# Patient Record
Sex: Female | Born: 1980 | Hispanic: Yes
Health system: Southern US, Community
[De-identification: ages and names within clinical notes are randomized; demographics above are authoritative.]

---

## 2011-05-05 ENCOUNTER — Encounter (HOSPITAL_COMMUNITY): Payer: Self-pay | Admitting: Emergency Medicine

## 2011-05-05 ENCOUNTER — Emergency Department (HOSPITAL_COMMUNITY)
Admission: EM | Admit: 2011-05-05 | Discharge: 2011-05-06 | Disposition: A | Discharge status: Home / Self Care | Payer: Self-pay | Attending: Emergency Medicine | Admitting: Emergency Medicine

## 2011-05-05 DIAGNOSIS — R109 Unspecified abdominal pain: Secondary | ICD-10-CM | POA: Insufficient documentation

## 2011-05-05 DIAGNOSIS — O039 Complete or unspecified spontaneous abortion without complication: Secondary | ICD-10-CM | POA: Insufficient documentation

## 2011-05-05 LAB — URINE MICROSCOPIC-ADD ON

## 2011-05-05 LAB — DIFFERENTIAL
Eosinophils Relative: 1 % (ref 0–5)
Lymphocytes Relative: 16 % (ref 12–46)
Lymphs Abs: 2 10*3/uL (ref 0.7–4.0)
Monocytes Absolute: 0.8 10*3/uL (ref 0.1–1.0)

## 2011-05-05 LAB — WET PREP, GENITAL
Clue Cells Wet Prep HPF POC: NONE SEEN
Trich, Wet Prep: NONE SEEN
WBC, Wet Prep HPF POC: NONE SEEN
Yeast Wet Prep HPF POC: NONE SEEN

## 2011-05-05 LAB — URINALYSIS, ROUTINE W REFLEX MICROSCOPIC
Glucose, UA: NEGATIVE mg/dL
Ketones, ur: NEGATIVE mg/dL
Protein, ur: NEGATIVE mg/dL

## 2011-05-05 LAB — BASIC METABOLIC PANEL
CO2: 24 mEq/L (ref 19–32)
Calcium: 9.2 mg/dL (ref 8.4–10.5)
Creatinine, Ser: 0.42 mg/dL — ABNORMAL LOW (ref 0.50–1.10)
Glucose, Bld: 109 mg/dL — ABNORMAL HIGH (ref 70–99)

## 2011-05-05 LAB — CBC
HCT: 38 % (ref 36.0–46.0)
MCV: 84.3 fL (ref 78.0–100.0)
RBC: 4.51 MIL/uL (ref 3.87–5.11)
WBC: 12.6 10*3/uL — ABNORMAL HIGH (ref 4.0–10.5)

## 2011-05-05 LAB — POCT PREGNANCY, URINE: Preg Test, Ur: POSITIVE — AB

## 2011-05-05 LAB — HCG, QUANTITATIVE, PREGNANCY: hCG, Beta Chain, Quant, S: 8528 m[IU]/mL — ABNORMAL HIGH (ref <5)

## 2011-05-05 LAB — ABO/RH: ABO/RH(D): B POS

## 2011-05-05 MED ORDER — SODIUM CHLORIDE 0.9 % IV BOLUS (SEPSIS)
1000.0000 mL | Freq: Once | INTRAVENOUS | Status: AC
  Administered 2011-05-05: 1000 mL via INTRAVENOUS

## 2011-05-05 MED ORDER — MORPHINE SULFATE 4 MG/ML IJ SOLN
6.0000 mg | Freq: Once | INTRAMUSCULAR | Status: AC
  Administered 2011-05-06: 4 mg via INTRAVENOUS
  Filled 2011-05-05: qty 2

## 2011-05-05 NOTE — ED Provider Notes (Signed)
History     CSN: [id number]  Arrival date & time 05/05/11  2020   First MD Initiated Contact with Patient 05/05/11 2109      Chief Complaint  Patient presents with  . Vaginal Bleeding    (Consider location/radiation/quality/duration/timing/severity/associated sxs/prior treatment) Patient is a 31 y.o. female presenting with vaginal bleeding. The history is provided by the patient.  Vaginal Bleeding  31 year old Hispanic pregnant female presents with vaginal bleeding and lower abdominal pain. She states she is about [redacted] weeks pregnant. She began having spotting yesterday and then the pain began today. She states the vaginal bleeding has gotten heavier today. She describes the pain as cramping. This is her 2nd pregnancy. No complications with the first pregnancy or delivery. She denies any discharge. She denies any nausea, shortness of breath, vomiting, diarrhea, dizziness, or headache.   History reviewed. No pertinent past medical history.  History reviewed. No pertinent past surgical history.  No family history on file.  History  Substance Use Topics  . Smoking status: Not on file  . Smokeless tobacco: Not on file  . Alcohol Use: Not on file    OB History    Grav Para Term Preterm Abortions TAB SAB Ect Mult Living                  Review of Systems  Genitourinary: Positive for vaginal bleeding.  All pertinent positives and negatives reviewed in the history of present illness  Allergies  Review of patient's allergies indicates no known allergies.  Home Medications   Current Outpatient Rx  Name Route Sig Dispense Refill  . PRENATAL MULTIVITAMIN CH Oral Take 1 tablet by mouth daily.      BP 127/78  Pulse 82  Temp(Src) 98.5 F (36.9 C) (Oral)  Resp 14  SpO2 100%  LMP 03/01/2011  Physical Exam  Constitutional: She is oriented to person, place, and time. She appears well-developed and well-nourished. No distress.  Cardiovascular: Regular rhythm.     Pulmonary/Chest: Effort normal and breath sounds normal.  Abdominal: Soft. Bowel sounds are normal. She exhibits no distension. There is tenderness in the right lower quadrant and left lower quadrant.  Genitourinary: Uterus normal. There is no rash, tenderness, lesion or injury on the right labia. There is no rash, tenderness, lesion or injury on the left labia. Cervix exhibits no motion tenderness. Right adnexum displays no mass and no tenderness. Left adnexum displays no mass and no tenderness. There is bleeding around the vagina.       Significant amount of bleeding. No tissue seen. Some clotting present.  The cervix has no trapped clots or debris  Musculoskeletal: Normal range of motion.  Neurological: She is alert and oriented to person, place, and time.  Skin: Skin is warm and dry. She is not diaphoretic.  Psychiatric: She has a normal mood and affect. Her behavior is normal.    ED Course  Procedures (including critical care time)        Patient has been stable throughout her visit here in the emergency department.  She will be advised to return here for any worsening in her condition..   MDM   Patient appears to be having a spontaneous abortion based on her testing here in the emergency department.  She is advised to followup at the [facility] MAU in 2 days for a recheck of her Quant.  Patient has an interpreter that it has given full disclosure of all the information.  All questions were  answered.  The patient is advised to return here as needed.  She will be given GYN followup.       Carlyle Dolly, PA-C 05/06/11 0124

## 2011-05-05 NOTE — ED Notes (Signed)
PT. REPORTS VAGINAL SPOTTING WITH LOW ABDOMINAL CRAMPING ONSET YESTERDAY, STATES [redacted] WEEKS PREGNANT , LMP=NOV. 23 , 2012 G2P1.

## 2011-05-05 NOTE — ED Notes (Signed)
Called [place], awaiting return call for pt to be sent for procedure

## 2011-05-06 ENCOUNTER — Emergency Department (HOSPITAL_COMMUNITY): Payer: Self-pay

## 2011-05-06 LAB — GC/CHLAMYDIA PROBE AMP, GENITAL
Chlamydia, DNA Probe: NEGATIVE
GC Probe Amp, Genital: NEGATIVE

## 2011-05-06 MED ORDER — HYDROCODONE-ACETAMINOPHEN 5-325 MG PO TABS: 1.0000 | ORAL_TABLET | Freq: Four times a day (QID) | ORAL | Status: AC | PRN

## 2011-05-06 NOTE — ED Provider Notes (Signed)
Medical screening examination/treatment/procedure(s) were conducted as a shared visit with non-physician practitioner(s) and myself.  I personally evaluated the patient during the encounter  approx [redacted] weeks pregnant and with heavy vaginal bleeding and lower abdominal pain  Diffuse lower abd pain on palpation.  Gravid uterus  Type and cross, cbc, [place], pelvic  Dayton Bailiff, MD 05/06/11 2012

## 2011-05-06 NOTE — ED Notes (Signed)
BP 104 systolic.  Morphine held d/t fact that pt off to [place].

## 2011-05-06 NOTE — Discharge Instructions (Signed)
Followup at the [facility] in MAU in 2 days for a recheck.  Return here as needed for any worsening in your condition.    Seguimiento en [facility] de la mujer en la MAU en 2 das para una nueva revisin. Volver aqu es necesario para cualquier agravamiento de su enfermedad.

## 2011-05-06 NOTE — ED Notes (Signed)
Reaction to pain med was buring in face, chest pressure & itching. These sx have resolved. Denies pain, itching, burning or CP. "feels better", family x2 at [facility] (also translating).

## 2011-05-06 NOTE — ED Notes (Signed)
PT returned from [place] and is aware she is losing pregnancy.  Pt crying.  PA is speaking with pt.  Pt offered tea and boyfriend called to come back.

## 2012-09-19 IMAGING — US US OB TRANSVAGINAL
1 series · 13 of 28 positions shown · non-contrast
Comparison: None.

CLINICAL DATA: Bleeding for 1 day.  Unsure of dates.  Quantitative
beta HCG 3923

OBSTETRIC <14 WK US AND TRANSVAGINAL OB US
TECHNIQUE: Both transabdominal and transvaginal ultrasound
examinations were performed for complete evaluation of the
gestation as well as the maternal uterus, adnexal regions, and
pelvic cul-de-sac.  Transvaginal technique was performed to assess
early pregnancy.

[Series 1: us ob transvaginal · 0.26mm/px · 13 of 48 slices shown]
[im 2/48]
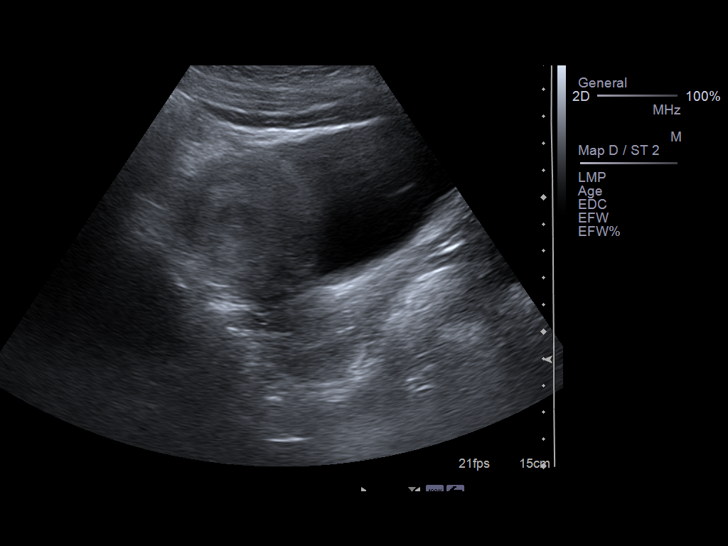
[im 6/48]
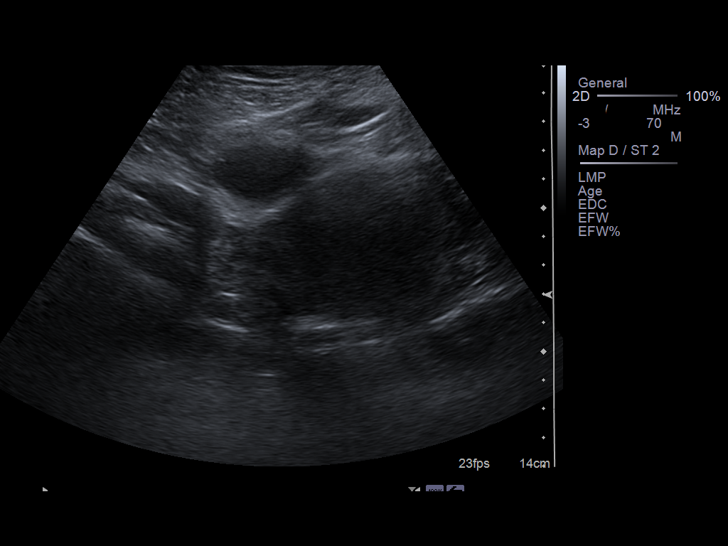
[im 9/48]
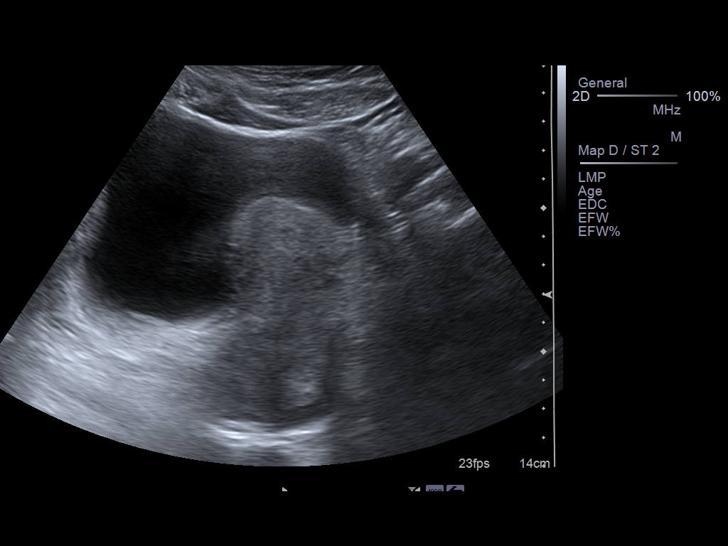
[im 13/48]
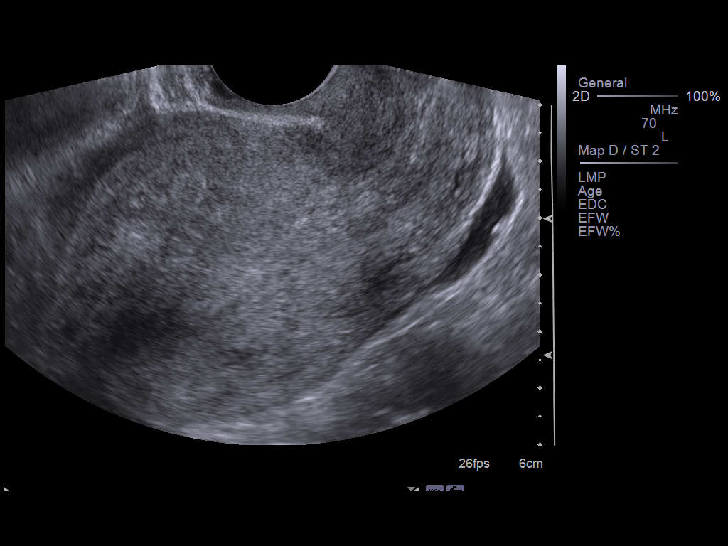
[im 16/48]
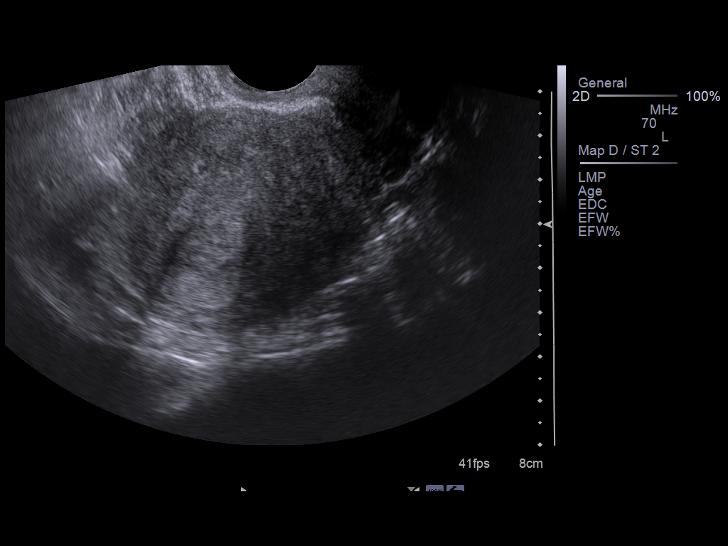
[im 20/48]
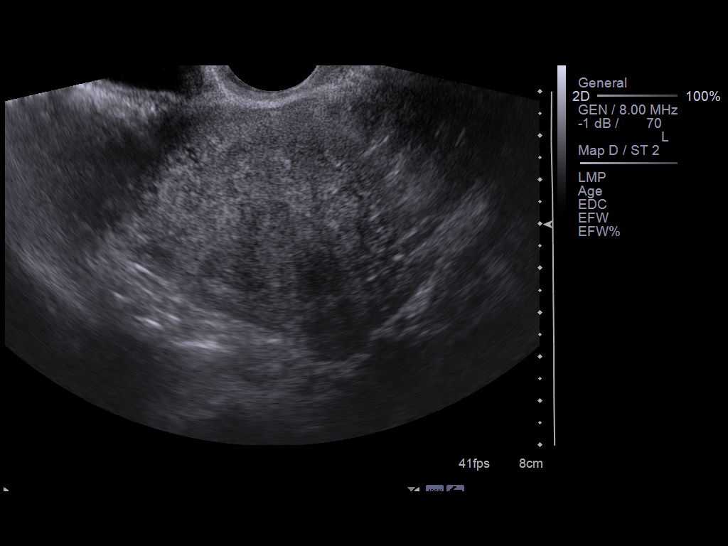
[im 25/48]
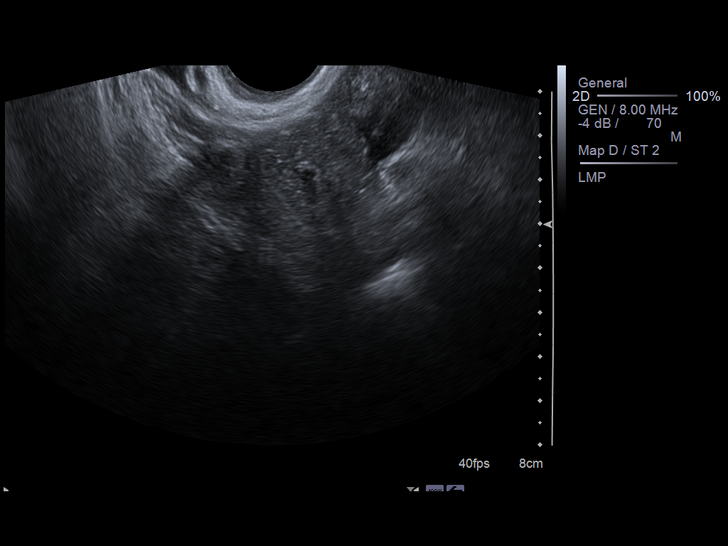
[im 28/48]
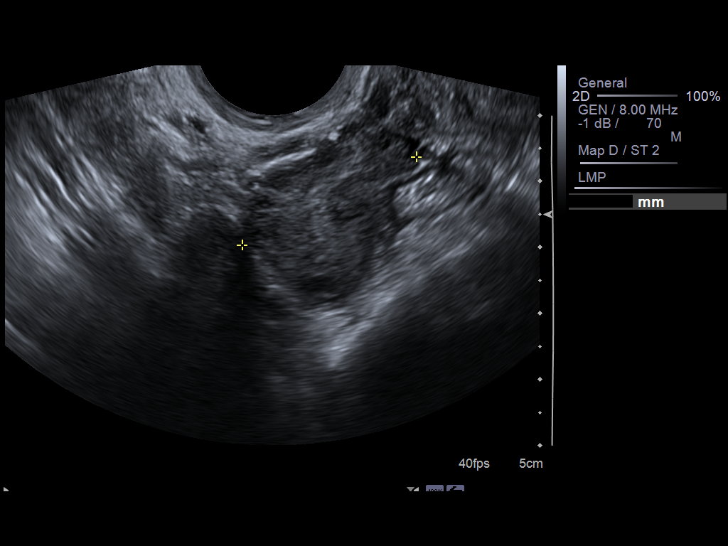
[im 32/48]
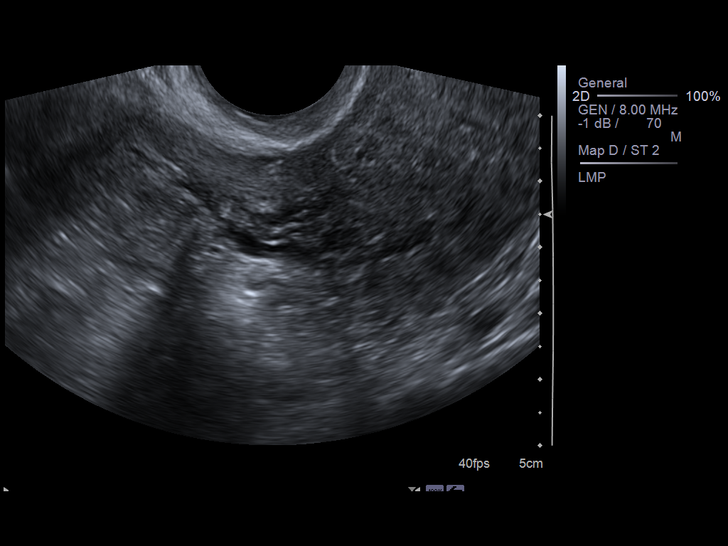
[im 35/48]
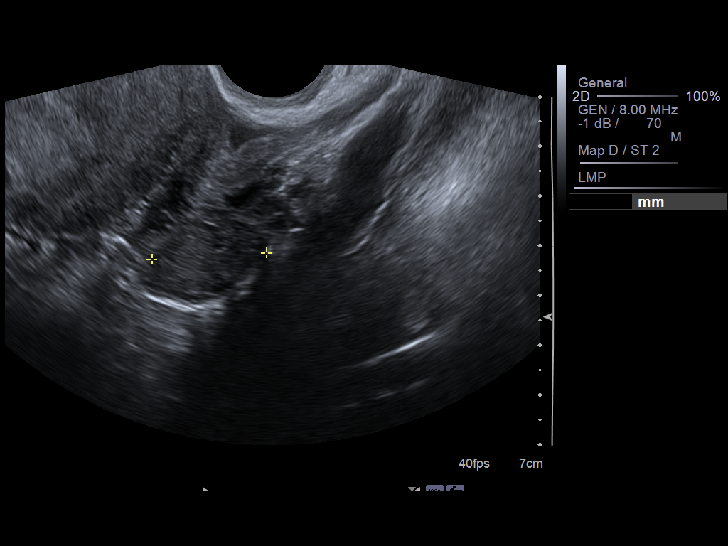
[im 39/48]
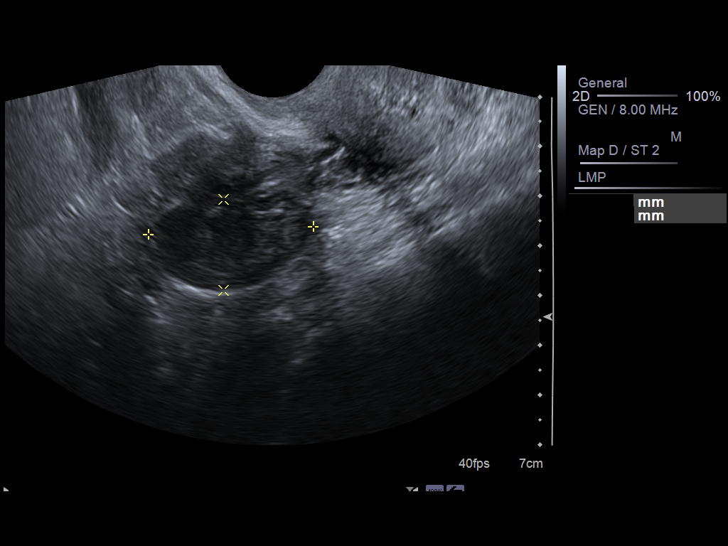
[im 42/48]
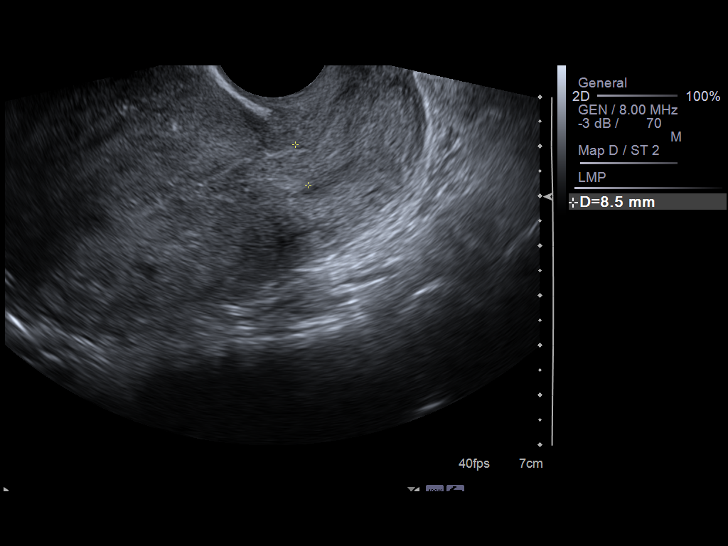
[im 46/48]
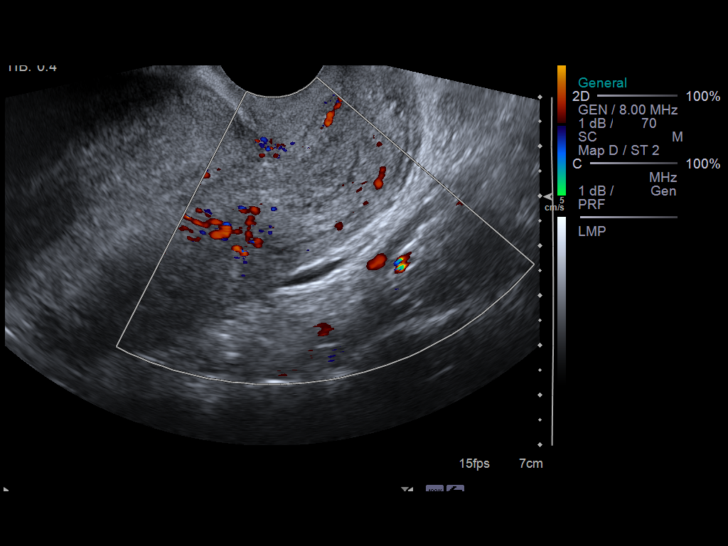

[13 of 28 positions shown; findings below may reference images not displayed]

Intrauterine gestational sac:  No intrauterine gestational sac is
visualized.
Yolk sac: Not visualized
Embryo: Not visualized
Cardiac Activity: Not visualized
Heart Rate: N/A bpm

Maternal uterus/adnexae:
The uterus measures 5.6 x 9 x 6.1 cm.  No myometrial masses.
Endometrial stripe thickness is normal, measuring 8 mm double wall.
Prominence of the endocervical canal with increased echogenicity
suggesting debris.  The right ovary measures 3 x 2.2 x 1.8 cm.  The
left ovary measures 2.3 x 3.3 x 1.8 cm.  Normal follicular changes
in both ovaries.  Flow is demonstrated in both ovaries on color
flow Doppler imaging.  No abnormal adnexal masses.  A small amount
of free fluid in the pelvis towards the right.
IMPRESSION: No intrauterine pregnancy demonstrated.  Suggestion of widening of
the endocervical canal with debris which might represent changes of
ongoing spontaneous abortion.  However, early intrauterine
pregnancy which is too small to see is not excluded.  Ectopic
pregnancy is not excluded.  Recommend follow-up with serial
quantitative beta HCG levels and short-term ultrasound in 1-2 weeks
as clinically indicated.

## 2013-03-25 ENCOUNTER — Other Ambulatory Visit (HOSPITAL_COMMUNITY): Payer: Self-pay | Admitting: Physician Assistant

## 2013-03-25 DIAGNOSIS — Z3689 Encounter for other specified antenatal screening: Secondary | ICD-10-CM

## 2013-03-25 LAB — OB RESULTS CONSOLE RUBELLA ANTIBODY, IGM: Rubella: IMMUNE

## 2013-03-25 LAB — OB RESULTS CONSOLE ANTIBODY SCREEN: Antibody Screen: NEGATIVE

## 2013-03-25 LAB — OB RESULTS CONSOLE GC/CHLAMYDIA
Chlamydia: NEGATIVE
Gonorrhea: NEGATIVE

## 2013-03-25 LAB — OB RESULTS CONSOLE HEPATITIS B SURFACE ANTIGEN: Hepatitis B Surface Ag: NEGATIVE

## 2013-03-25 LAB — OB RESULTS CONSOLE RPR: RPR: NONREACTIVE

## 2013-03-25 LAB — OB RESULTS CONSOLE HIV ANTIBODY (ROUTINE TESTING): HIV: NONREACTIVE

## 2013-03-25 LAB — OB RESULTS CONSOLE ABO/RH: RH Type: POSITIVE

## 2013-03-25 LAB — OB RESULTS CONSOLE GBS: GBS: NEGATIVE

## 2013-04-08 NOTE — L&D Delivery Note (Signed)
`````  Attestation of Attending Supervision of Advanced Practitioner: Evaluation and management procedures were performed by the PA/NP/CNM/OB Fellow under my supervision/collaboration. Chart reviewed and agree with management and plan.  Devinn Hurwitz V 09/22/2013 8:16 AM

## 2013-04-08 NOTE — L&D Delivery Note (Signed)
Delivery Note At 12:36 PM a viable female was delivered via VBAC, Spontaneous (Presentation: Right Occiput Anterior).  APGAR: 8, 9; weight .   Placenta status: Intact, Spontaneous.  Cord: 3 vessels with the following complications: None.  Cord pH: NA   Anesthesia: Local  Episiotomy: None Lacerations: 1st degree;Sulcus Suture Repair: 3.0 monocyrl Est. Blood Loss (mL): 350  Mom to postpartum.  Baby to Couplet care / Skin to Skin.  Called to delivery. Mother pushed over 1st degree tear and sulcal tear. Infant delivered to maternal abdomen. Cord clamped and cut. Active management of 3rd stage with traction and Pitocin IM. Placenta delivered intact with 3v cord. Tear repaired with 3.0 monocryl on CT in usual manner. EBL 350. Counts correct. Hemostatic.   Tawana ScaleMichael Ryan Asees Manfredi, MD OB Fellow 09/18/2013, 12:57 PM

## 2013-05-04 ENCOUNTER — Ambulatory Visit (HOSPITAL_COMMUNITY)
Admission: RE | Admit: 2013-05-04 | Discharge: 2013-05-04 | Disposition: A | Discharge status: Home / Self Care | Payer: Medicaid Other | Source: Ambulatory Visit | Attending: Physician Assistant | Admitting: Physician Assistant

## 2013-05-04 DIAGNOSIS — Z3689 Encounter for other specified antenatal screening: Secondary | ICD-10-CM

## 2013-09-02 LAB — OB RESULTS CONSOLE GBS: STREP GROUP B AG: NEGATIVE

## 2013-09-15 ENCOUNTER — Encounter (HOSPITAL_COMMUNITY): Admission: RE | Payer: Self-pay | Source: Ambulatory Visit

## 2013-09-15 ENCOUNTER — Inpatient Hospital Stay (HOSPITAL_COMMUNITY)
Admission: RE | Admit: 2013-09-15 | Payer: Medicaid Other | Source: Ambulatory Visit | Admitting: Obstetrics & Gynecology

## 2013-09-15 SURGERY — Surgical Case
Anesthesia: Regional | Site: Abdomen

## 2013-09-18 ENCOUNTER — Inpatient Hospital Stay (HOSPITAL_COMMUNITY)
Admission: AD | Admit: 2013-09-18 | Discharge: 2013-09-19 | DRG: 775 | Disposition: A | Discharge status: Home / Self Care | Payer: Medicaid Other | Source: Ambulatory Visit | Attending: Obstetrics and Gynecology | Admitting: Obstetrics and Gynecology

## 2013-09-18 DIAGNOSIS — O479 False labor, unspecified: Secondary | ICD-10-CM | POA: Diagnosis present

## 2013-09-18 DIAGNOSIS — IMO0001 Reserved for inherently not codable concepts without codable children: Secondary | ICD-10-CM

## 2013-09-18 DIAGNOSIS — O34219 Maternal care for unspecified type scar from previous cesarean delivery: Secondary | ICD-10-CM

## 2013-09-18 LAB — ABO/RH: ABO/RH(D): B POS

## 2013-09-18 LAB — TYPE AND SCREEN
ABO/RH(D): B POS
Antibody Screen: NEGATIVE

## 2013-09-18 LAB — RPR

## 2013-09-18 MED ORDER — IBUPROFEN 600 MG PO TABS
600.0000 mg | ORAL_TABLET | Freq: Four times a day (QID) | ORAL | Status: DC
  Administered 2013-09-18 – 2013-09-19 (×4): 600 mg via ORAL
  Filled 2013-09-18 (×4): qty 1

## 2013-09-18 MED ORDER — OXYCODONE-ACETAMINOPHEN 5-325 MG PO TABS
1.0000 | ORAL_TABLET | ORAL | Status: DC | PRN
  Administered 2013-09-18 (×2): 1 via ORAL
  Administered 2013-09-19 (×2): 2 via ORAL
  Filled 2013-09-18: qty 2
  Filled 2013-09-18: qty 1
  Filled 2013-09-18 (×2): qty 2

## 2013-09-18 MED ORDER — DIPHENHYDRAMINE HCL 50 MG/ML IJ SOLN: 12.5000 mg | INTRAMUSCULAR | Status: DC | PRN

## 2013-09-18 MED ORDER — LACTATED RINGERS IV SOLN
INTRAVENOUS | Status: DC
  Administered 2013-09-18: 09:00:00 via INTRAVENOUS

## 2013-09-18 MED ORDER — ONDANSETRON HCL 4 MG PO TABS: 4.0000 mg | ORAL_TABLET | ORAL | Status: DC | PRN

## 2013-09-18 MED ORDER — DIPHENHYDRAMINE HCL 25 MG PO CAPS: 25.0000 mg | ORAL_CAPSULE | Freq: Four times a day (QID) | ORAL | Status: DC | PRN

## 2013-09-18 MED ORDER — EPHEDRINE 5 MG/ML INJ
10.0000 mg | INTRAVENOUS | Status: DC | PRN
  Filled 2013-09-18: qty 2

## 2013-09-18 MED ORDER — DIBUCAINE 1 % RE OINT: 1.0000 "application " | TOPICAL_OINTMENT | RECTAL | Status: DC | PRN

## 2013-09-18 MED ORDER — FENTANYL CITRATE 0.05 MG/ML IJ SOLN
100.0000 ug | INTRAMUSCULAR | Status: DC | PRN
  Administered 2013-09-18 (×2): 100 ug via INTRAVENOUS
  Filled 2013-09-18 (×2): qty 2

## 2013-09-18 MED ORDER — ZOLPIDEM TARTRATE 5 MG PO TABS: 5.0000 mg | ORAL_TABLET | Freq: Every evening | ORAL | Status: DC | PRN

## 2013-09-18 MED ORDER — ONDANSETRON HCL 4 MG/2ML IJ SOLN: 4.0000 mg | INTRAMUSCULAR | Status: DC | PRN

## 2013-09-18 MED ORDER — SENNOSIDES-DOCUSATE SODIUM 8.6-50 MG PO TABS
2.0000 | ORAL_TABLET | ORAL | Status: DC
  Administered 2013-09-19: 2 via ORAL
  Filled 2013-09-18: qty 2

## 2013-09-18 MED ORDER — LANOLIN HYDROUS EX OINT: TOPICAL_OINTMENT | CUTANEOUS | Status: DC | PRN

## 2013-09-18 MED ORDER — OXYTOCIN 40 UNITS IN LACTATED RINGERS INFUSION - SIMPLE MED
62.5000 mL/h | INTRAVENOUS | Status: DC
  Filled 2013-09-18: qty 1000

## 2013-09-18 MED ORDER — LACTATED RINGERS IV SOLN: 500.0000 mL | INTRAVENOUS | Status: DC | PRN

## 2013-09-18 MED ORDER — BENZOCAINE-MENTHOL 20-0.5 % EX AERO
1.0000 "application " | INHALATION_SPRAY | CUTANEOUS | Status: DC | PRN
  Filled 2013-09-18: qty 56

## 2013-09-18 MED ORDER — WITCH HAZEL-GLYCERIN EX PADS: 1.0000 "application " | MEDICATED_PAD | CUTANEOUS | Status: DC | PRN

## 2013-09-18 MED ORDER — ACETAMINOPHEN 325 MG PO TABS: 650.0000 mg | ORAL_TABLET | ORAL | Status: DC | PRN

## 2013-09-18 MED ORDER — OXYCODONE-ACETAMINOPHEN 5-325 MG PO TABS
1.0000 | ORAL_TABLET | ORAL | Status: DC | PRN
  Administered 2013-09-18 (×2): 1 via ORAL
  Filled 2013-09-18 (×2): qty 1

## 2013-09-18 MED ORDER — ONDANSETRON HCL 4 MG/2ML IJ SOLN: 4.0000 mg | Freq: Four times a day (QID) | INTRAMUSCULAR | Status: DC | PRN

## 2013-09-18 MED ORDER — PHENYLEPHRINE 40 MCG/ML (10ML) SYRINGE FOR IV PUSH (FOR BLOOD PRESSURE SUPPORT)
80.0000 ug | PREFILLED_SYRINGE | INTRAVENOUS | Status: DC | PRN
  Filled 2013-09-18: qty 2

## 2013-09-18 MED ORDER — OXYTOCIN BOLUS FROM INFUSION: 500.0000 mL | INTRAVENOUS | Status: DC

## 2013-09-18 MED ORDER — IBUPROFEN 600 MG PO TABS
600.0000 mg | ORAL_TABLET | Freq: Four times a day (QID) | ORAL | Status: DC | PRN
  Administered 2013-09-18: 600 mg via ORAL
  Filled 2013-09-18: qty 1

## 2013-09-18 MED ORDER — CITRIC ACID-SODIUM CITRATE 334-500 MG/5ML PO SOLN: 30.0000 mL | ORAL | Status: DC | PRN

## 2013-09-18 MED ORDER — LIDOCAINE HCL (PF) 1 % IJ SOLN
30.0000 mL | INTRAMUSCULAR | Status: DC | PRN
  Administered 2013-09-18: 30 mL via SUBCUTANEOUS
  Filled 2013-09-18: qty 30

## 2013-09-18 MED ORDER — OXYTOCIN 10 UNIT/ML IJ SOLN
INTRAMUSCULAR | Status: AC
  Administered 2013-09-18: 10 [IU] via INTRAMUSCULAR
  Filled 2013-09-18: qty 2

## 2013-09-18 MED ORDER — TETANUS-DIPHTH-ACELL PERTUSSIS 5-2.5-18.5 LF-MCG/0.5 IM SUSP: 0.5000 mL | Freq: Once | INTRAMUSCULAR | Status: DC

## 2013-09-18 MED ORDER — PRENATAL MULTIVITAMIN CH
1.0000 | ORAL_TABLET | Freq: Every day | ORAL | Status: DC
  Administered 2013-09-18 – 2013-09-19 (×2): 1 via ORAL
  Filled 2013-09-18 (×2): qty 1

## 2013-09-18 MED ORDER — LACTATED RINGERS IV SOLN: 500.0000 mL | Freq: Once | INTRAVENOUS | Status: DC

## 2013-09-18 MED ORDER — FENTANYL 2.5 MCG/ML BUPIVACAINE 1/10 % EPIDURAL INFUSION (WH - ANES): 14.0000 mL/h | INTRAMUSCULAR | Status: DC | PRN

## 2013-09-18 MED ORDER — SIMETHICONE 80 MG PO CHEW: 80.0000 mg | CHEWABLE_TABLET | ORAL | Status: DC | PRN

## 2013-09-18 NOTE — H&P (Signed)
Catherine Krause is a 33 y.o. female G2P1 with IUP at 4371w0d presenting for active labor. At 0300 this morning pt started having contractions that have increased in frequency and intensity. She denies any LOF or VB. Pt states the fetus is moving normally.  Pt's last delivery was 3700g (8.15lb) and was c-section for possible fetal distress. Pt feels this baby is about the same size. She was consented on 5/28  With unknown scar for TOLAC. Prior consent was for repeat, this consent and pt desire trumps previous consent.  PNCare at HD since 13 wks  Prenatal History/Complications: Hx of 8lb infant delivered in [place] by c-section for fetal intolerance.  Past Medical History: Past Medical History  Diagnosis Date  . Medical history non-contributory     Past Surgical History: Past Surgical History  Procedure Laterality Date  . Cesarean section      Obstetrical History: OB History   Grav Para Term Preterm Abortions TAB SAB Ect Mult Living   2 1        1       Gynecological History: OB History   Grav Para Term Preterm Abortions TAB SAB Ect Mult Living   2 1        1       Social History: History   Social History  . Marital Status: Single    Spouse Name: N/A    Number of Children: N/A  . Years of Education: N/A   Social History Main Topics  . Smoking status: Never Smoker   . Smokeless tobacco: Never Used  . Alcohol Use: No  . Drug Use: No  . Sexual Activity: Yes    Birth Control/ Protection: None   Other Topics Concern  . None   Social History Narrative  . None    Family History: No family history on file.  Allergies: No Known Allergies  Prescriptions prior to admission  Medication Sig Dispense Refill  . Prenatal Vit-Fe Fumarate-FA (PRENATAL MULTIVITAMIN) TABS tablet Take 1 tablet by mouth daily at 12 noon.         Review of Systems   Constitutional: painful contractions only complaint at this time.  Blood pressure 140/86, pulse 83, temperature 97.9 F (36.6  C), temperature source Oral, resp. rate 18. General appearance: alert, cooperative, appears stated age and no distress Lungs: clear to auscultation bilaterally Heart: regular rate and rhythm Abdomen: soft, non-tender; bowel sounds normal Pelvic: adequate Extremities: Homans sign is negative, no sign of DVT Presentation: cephalic Fetal monitoringBaseline: 150s bpm, Variability: Good {> 6 bpm), Accelerations: Reactive and Decelerations: Absent Uterine activityq3-604min  Dilation: 4 Effacement (%): 80 Station: 0 Exam by:: Dr. Ike Benedom   Prenatal labs: ABO, Rh:   Antibody:   Rubella:   RPR:    HBsAg:    HIV:    GBS:    1 hr Glucola 167 - Neg 3 hr Genetic screening  Normal quad Anatomy US WNL 19wk    Prenatal Transfer Tool  Maternal Diabetes: No Genetic Screening: Normal Maternal Ultrasounds/Referrals: Abnormal:  Findings:   Other: Concern for hypospaida Fetal Ultrasounds or other Referrals:  None Maternal Substance Abuse:  No Significant Maternal Medications:  None Significant Maternal Lab Results: Lab values include: Group B Strep negative     Results for orders placed during the hospital encounter of 09/18/13 (from the past 24 hour(s))  CBC   Collection Time    09/18/13  9:25 AM      Result Value Ref Range   WBC 11.5 (*) 4.0 -  10.5 K/uL   RBC 4.75  3.87 - 5.11 MIL/uL   Hemoglobin 14.4  12.0 - 15.0 g/dL   HCT 11.941.0  14.736.0 - 82.946.0 %   MCV 86.3  78.0 - 100.0 fL   MCH 30.3  26.0 - 34.0 pg   MCHC 35.1  30.0 - 36.0 g/dL   RDW 56.213.4  13.011.5 - 86.515.5 %   Platelets 175  150 - 400 K/uL    Assessment: Catherine Krause is a 33 y.o. G2P1 at 8750w0d by L=19 here for TOLAC #Labor: Spontaneous labor, will observe and if necessary augment, confirmed consent for TOLAC. Pt continues to desire vaginal birth. #Pain: IV or epidural per patient request #FWB: Cat I, conscern for hypospadia on US #ID:  GBS neg #MOF: Breast    Aricka Goldberger RYAN 09/18/2013, 10:14 AM

## 2013-09-18 NOTE — H&P (Signed)
`````  Attestation of Attending Supervision of Advanced Practitioner: Evaluation and management procedures were performed by the PA/NP/CNM/OB Fellow under my supervision/collaboration. Chart reviewed and agree with management and plan.  Solveig Fangman V 09/18/2013 1:02 PM

## 2013-09-18 NOTE — MAU Note (Signed)
G2P1, prior c/s, plans VBAC. Contractions since 0300, progressively worsening since 0530. Reports +FM, bloody show.

## 2013-09-18 NOTE — MAU Note (Signed)
Pt states she has been having uc's since 0300, became very intense @ 0500, denies bleeding or LOF.

## 2013-09-18 NOTE — Lactation Note (Addendum)
This note was copied from the chart of Catherine Krause. Lactation Consultation Note Initial visit at 9 hours of age.  Mom has daughter present and prefers her to interpret.  Mom is aware of interpreter services available to her.  Mom reports breat feeding is going well.   Baby has had 4 breast feeding.  [facility] given and discussed.  Encouraged to feed with early cues on demand.  Early newborn behavior discussed.  Hand expression demonstrated with colostrum visible. Assisted with latch in cross cradle hold.  Baby is able to latch with wide flanged lips and few sucking burst, but no milk transfer noted at this time. Encouraged depth with cheeks, chin and nose touching breast for feedings. Mom to call for assist as needed.    Patient Name: Catherine Krause [id number]'s Date: 09/18/2013 Reason for consult: Initial assessment   Maternal Data Has patient been taught Hand Expression?: Yes Does the patient have breastfeeding experience prior to this delivery?: Yes  Feeding Feeding Type: Breast Fed Length of feed: 5 min  LATCH Score/Interventions Latch: Grasps breast easily, tongue down, lips flanged, rhythmical sucking.  Audible Swallowing: None  Type of Nipple: Everted at rest and after stimulation  Comfort (Breast/Nipple): Soft / non-tender     Hold (Positioning): Assistance needed to correctly position infant at breast and maintain latch. Intervention(s): Breastfeeding basics reviewed;Support Pillows;Position options;Skin to skin  LATCH Score: 7  Lactation Tools Discussed/Used     Consult Status Consult Status: Follow-up Date: 09/19/13 Follow-up type: In-patient    Beverely RisenShoptaw, Arvella MerlesJana Lynn 09/18/2013, 10:36 PM

## 2013-09-19 MED ORDER — IBUPROFEN 600 MG PO TABS: 600.0000 mg | ORAL_TABLET | Freq: Four times a day (QID) | ORAL | Status: DC

## 2013-09-19 NOTE — Lactation Note (Signed)
This note was copied from the chart of Catherine Krause. Lactation Consultation Note    Follow up consult with this mom and baby, now at 21 hours olod. Mom was feeding formula when i walked in, because baby was cluster feeding, and mom thought he was not getting enough to eat. Her fried was in the room, and was acting as interporter for her. I explained cluster feeding, how some crying,cuing was a request for skin to skin, , LEAD. Mom very receptive to teachign, ahppily placed baby skin to skin, and stopped bottle/formula feeding while I was in the room. I also mentioned that mom could sleep with baby STS, as long as a family menber in the room watched tha the baby was safe during her nap.   Patient Name: Catherine Krause [id number]'s Date: 09/19/2013 Reason for consult: Follow-up assessment   Maternal Data    Feeding    LATCH Score/Interventions                      Lactation Tools Discussed/Used     Consult Status Consult Status: Complete Follow-up type: Call as needed    Alfred LevinsLee, Holton Sidman Anne 09/19/2013, 10:29 AM

## 2013-09-19 NOTE — Discharge Instructions (Signed)
Parto vaginal (Vaginal Delivery) Durante el parto, el mdico la ayudar a dar a luz a su beb. En el parto vaginal, deber pujar para que el beb salga por la vagina. Sin embargo, antes de que pueda sacar al beb, es necesario que ocurran ciertas cosas. La abertura del tero (cuello del tero) tiene que ablandarse, hacerse ms delgado y abrirse (dilatar) hasta que llegue a 10 cm. Adems, el beb tiene que bajar desde el tero a la vagina.  SIGNOS DE TRABAJO DE PARTO  El mdico tendr primero que asegurarse de que usted est en trabajo de parto. Algunos signos son:   Eliminar lo que se llama tapn mucoso antes del inicio del trabajo de parto. Este es una pequea cantidad de mucosidad teida con sangre.  Tener contracciones uterinas regulares y dolorosas.  El tiempo entre las contracciones debe acortarse.  Las molestias y el dolor se harn ms intensos gradualmente.  El dolor de las contracciones empeora al caminar y no se alivia con el reposo.  El cuello del tero se hace mas delgado (se borra) y se dilata. ANTES DEL PARTO Una vez que se inicie el trabajo de parto y sea admitida en el hospital o sanatorio, el mdico podr hacer lo siguiente:   Realizar un examen fsico.  Controlar si hay complicaciones relacionadas con el trabajo de parto.  Verificar su presin arterial, temperatura y pulso y la frecuencia cardaca (signos vitales).  Determinar si se ha roto el saco amnitico y cundo ha ocurrido.  Realizar un examen vaginal (utilizando un guante estril y un lubricante) para determinar:  La posicin (presentacin) del beb. El beb se presenta con la cabeza primero (vertex) en el canal de parto (vagina), o estn los pies o las nalgas primero (de nalgas)?  El nivel (estacin) de la cabeza del beb dentro del canal de parto.  El borramiento y la dilatacin del cuello uterino.  El monitor fetal electrnico generalmente se coloca sobre el abdomen al llegar. Se utiliza para controlar  las contracciones y la frecuencia cardaca del beb.  Cuando el monitor est en el abdomen (monitor fetal externo), slo toma la frecuencia y la duracin de las contracciones. No informa acerca de la intensidad de las contracciones.  Si el mdico necesita saber exactamente la intensidad de las contracciones o cul es la frecuencia cardaca del beb, colocar un monitor interno en la vagina y el tero. El mdico comentar los riesgos y los beneficios de usar un monitor interno y le pedir autorizacin antes de colocar el dispositivo.  El monitoreo fetal continuo ser necesario si le han aplicado una epidural, si le administran ciertos medicamentos (como oxitocina) y si tiene complicaciones del embarazo o del trabajo de parto.  Podrn colocarle una va intravenosa en una vena del brazo para suministrarle lquidos y medicamentos, si es necesario. TRES ETAPAS DEL TRABAJO DE PARTO Y EL PARTO El trabajo de parto y el parto normales se dividen en tres etapas. Primera etapa Esta etapa comienza cuando comienzan las contracciones regulares y el cuello comienza a borrarse y dilatarse. Finaliza cuando el cuello est completamente abierto (completamente dilatado). La primera etapa es la etapa ms larga del trabajo de parto y puede durar desde 3 horas a 15 horas.  Algunos mtodos estn disponibles para ayudar con el dolor del parto. Usted y su mdico decidirn qu opcin es la mejor para usted. Las opciones incluyen:   Medicamentos narcticos. Estos son medicamentos fuertes que usted puede recibir a travs de una va intravenosa o como   inyeccin en el msculo. Estos medicamentos alivian el dolor pero no hacen que desaparezca completamente.  Epidural. Se administra un medicamento a travs de un tubo delgado que se inserta en la espalda. El medicamento adormece la parte inferior del cuerpo y evita el dolor en esa zona.  Bloqueo paracervical Es una inyeccin de un anestsico en cada lado del cuello  uterino.  Usted podr pedir un parto natural, que implica que no se usen analgsicos ni epidural durante el parto y el trabajo de parto. En cambio, podr tener otro tipo de ayuda como ejercicios respiratorios para hacer frente al dolor. Segunda etapa La segunda etapa del trabajo de parto comienza cuando el cuello se ha dilatado completamente a 10 cm. Contina hasta que usted puja al beb hacia abajo, por el canal de parto, y el beb nace. Esta etapa puede durar slo algunos minutos o algunas horas.  La posicin del la cabeza del beb a medida que pasa por el canal de parto, es informada como un nmero, llamado estacin. Si la cabeza del beb no ha iniciado su descenso, la estacin se describe como que est en menos 3 ( 3). Cuando la cabeza del beb est en la estacin cero, est en el medio del canal de parto y se encaja en la pelvis. La estacin en la que se encuentra el beb indica el progreso de la segunda etapa del trabajo de parto.  Cuando el beb nace, el mdico lo sostendr con la cabeza hacia abajo para evitar que el lquido amnitico, el moco y la sangre entren en los pulmones del beb. La boca y la nariz del beb podrn ser succionadas con un pequeo bulbo para retirar todo lquido adicional.  El mdico podr colocar al beb sobre su estmago. Es importante evitar que el beb tome fro. Para hacerlo, el mdico secar al beb, lo colocar directamente sobre su piel, (sin mantas entre usted y el beb) y lo cubrir con mantas secas y tibias.  Se corta el cordn umbilical. Tercera etapa Durante la tercera etapa del trabajo de parto, el mdico sacar la placenta (alumbramiento) y se asegurar de que el sangrado est controlado. La salida de la placenta generalmente demora 5 minutos pero puede tardar hasta 30 minutos. Luego de la salida de la placenta, le darn un medicamento por va intravenosa o inyectable para ayudar a contraer el tero y controlar el sangrado. Si planea amamantar al beb, puede  intentar en este momento. Luego de la salida de la placenta, el tero debe contraerse y quedar muy firme. Si el tero no queda firme, el mdico lo masajear. Esto es importante debido a que la contraccin del tero ayuda a cortar el sangrado en el sitio en que la placenta estaba unida al tero. Si el tero no se contrae adecuadamente ni permanece firme, podr causar un sangrado abundante. Si hay mucho sangrado, podrn darle medicamentos para contraer el tero y detener el sangrado.  Document Released: 03/07/2008 Document Revised: 11/25/2012 [employer] Patient Information 2014 [employer].  

## 2013-09-19 NOTE — Discharge Summary (Signed)
Obstetric Discharge Summary Reason for Admission: onset of labor and TOLAC Prenatal Procedures: ultrasound Intrapartum Procedures: spontaneous vaginal delivery Postpartum Procedures: none Complications-Operative and Postpartum: 1st  degree perineal laceration  Hospital Course:  Catherine Krause is a 33 y.o. female G2P1 with IUP at 6671w0d presented for active labor. At 0300 pt started having contractions that have increased in frequency and intensity. She denied any LOF or VB. Pt stated the fetus is moving normally. Previsou delivery CS for fetal distress, unknown scar, pt desired TOLAC, consent obtained. Labor progressed spontaneously. See delivery note below.  Pt meeting postpartum milestones. Pain controlled, ambulating and voiding well, breastfeeding well, lochia decreasing. [facility] Instructed pelvic rest. Pt desires discharge home, pending pediatrician eval.    Delivery Note  At 12:36 PM a viable female was delivered via VBAC, Spontaneous (Presentation: Right Occiput Anterior). APGAR: 8, 9; weight .  Placenta status: Intact, Spontaneous. Cord: 3 vessels with the following complications: None. Cord pH: NA  Anesthesia: Local  Episiotomy: None  Lacerations: 1st degree;Sulcus  Suture Repair: 3.0 monocyrl  Est. Blood Loss (mL): 350  Mom to postpartum. Baby to Couplet care / Skin to Skin.  Called to delivery. Mother pushed over 1st degree tear and sulcal tear. Infant delivered to maternal abdomen. Cord clamped and cut. Active management of 3rd stage with traction and Pitocin IM. Placenta delivered intact with 3v cord. Tear repaired with 3.0 monocryl on CT in usual manner. EBL 350. Counts correct. Hemostatic.  Catherine ScaleMichael Ryan Jaquanna Ballentine, MD  OB Fellow  09/18/2013, 12:57 PM          H/H: Lab Results  Component Value Date/Time   HGB 14.4 09/18/2013  9:25 AM   HCT 41.0 09/18/2013  9:25 AM    Filed Vitals:   09/19/13 0640  BP: 101/62  Pulse: 78  Temp: 97.6 F  (36.4 C)  Resp: 18    Physical Exam: VSS NAD Abd: Appropriately tender, ND, Fundus @Umbilicua  Incision: n/a  No c/c/e, Neg homan's sign, neg cords Lochia Appropriate  Discharge Diagnoses: Term Pregnancy-delivered and VBAC  Discharge Information: Date: 10/18/2010 Activity: pelvic rest Diet: routine  Medications: PNV and Ibuprofen Breast feeding:  Yes Condition: stable Instructions: refer to handout Discharge to: home      Medication List         ibuprofen 600 MG tablet  Commonly known as:  ADVIL,MOTRIN  Take 1 tablet (600 mg total) by mouth every 6 (six) hours.     prenatal multivitamin Tabs tablet  Take 1 tablet by mouth daily at 12 noon.           Follow-up Information   [facility] [place]. Schedule an appointment as soon as possible for a visit in 4 weeks. (For Postpartum Visit and IUD placement)    Contact information:   [address] [place] [address] [phone]      Sunnie Nielsenlexander, Natalie 09/19/2013,7:54 AM   I spoke with and examined patient and agree with resident's note and plan of care.  Catherine ScaleMichael Ryan Taeja Debellis, MD OB Fellow 09/20/2013 8:18 AM

## 2013-09-20 ENCOUNTER — Ambulatory Visit: Payer: Self-pay

## 2013-09-20 NOTE — Lactation Note (Signed)
This note was copied from the chart of Catherine Krause. Lactation Consultation Note  [facility]  [facility] [phone]#[id number]. Baby sleeping with double phototherapy. Mother states she does not have enough milk.  Mother's breasts are filling.  She is supplementing with formula. Mother states baby only breastfeeds for a few minutes and falls asleep then wants to breatfeed again in 15 minutes. Reviewed cluster feeding, breast massage to keep him active at the breast, waking techniques and increased bilirubin can make the infant sleepy. Suggested she use call bell with next feed and ask for lactation assistance to help with longer feedings.   Patient Name: Catherine Krause [id number]'WToday's Date: 09/20/2013 Reason for consult: Follow-up assessment   Maternal Data    Feeding Feeding Type: Bottle Fed - Formula  LATCH Score/Interventions Latch: Grasps breast easily, tongue down, lips flanged, rhythmical sucking. Intervention(s): Adjust position;Breast compression  Audible Swallowing: Spontaneous and intermittent Intervention(s): Hand expression;Skin to skin Intervention(s): Skin to skin;Hand expression  Type of Nipple: Everted at rest and after stimulation  Comfort (Breast/Nipple): Soft / non-tender     Hold (Positioning): No assistance needed to correctly position infant at breast.  LATCH Score: 10  Lactation Tools Discussed/Used     Consult Status Consult Status: Follow-up Date: 09/21/13 Follow-up type: In-patient    Dahlia ByesBerkelhammer, [name] 09/20/2013, 10:10 AM

## 2013-09-20 NOTE — Lactation Note (Signed)
This note was copied from the chart of Catherine Krause. Lactation Consultation Note  Follow up:  Daughter in room, helping with some translation. Mother placed baby in cradle hold.  Sucks and swallows observed but mother uncomfortable. LS8. Assisted mother in side lying position.  Mother states improved comfort.  Baby breastfed > 25 min. Demonstrated how to keep light on baby while breastfeeding. Reviewed mother can use ebm for soreness. Reviewed volume guidelines in spanish. Encouraged her to call if further assistance is needed.    Patient Name: Catherine Krause WUXLK'GToday's Date: 09/20/2013 Reason for consult: Follow-up assessment   Maternal Data    Feeding Feeding Type: Breast Fed Length of feed: 25 min  LATCH Score/Interventions Latch: Grasps breast easily, tongue down, lips flanged, rhythmical sucking. Intervention(s): Adjust position;Assist with latch;Breast massage  Audible Swallowing: Spontaneous and intermittent  Type of Nipple: Everted at rest and after stimulation  Comfort (Breast/Nipple): Filling, red/small blisters or bruises, mild/mod discomfort  Problem noted: Mild/Moderate discomfort Interventions (Mild/moderate discomfort): Hand expression  Hold (Positioning): Assistance needed to correctly position infant at breast and maintain latch. Intervention(s): Support Pillows;Position options;Skin to skin  LATCH Score: 8  Lactation Tools Discussed/Used     Consult Status Consult Status: Follow-up Date: 09/21/13 Follow-up type: In-patient    Dahlia ByesBerkelhammer, Ruth [facility] 09/20/2013, 12:46 PM

## 2013-09-23 ENCOUNTER — Encounter (HOSPITAL_COMMUNITY): Payer: Self-pay | Admitting: Family

## 2013-09-23 LAB — CBC
HEMATOCRIT: 41 % (ref 33.0–44.0)
Hemoglobin: 14.4 g/dL (ref 11.0–14.6)
MCH: 30.3 pg (ref 25.0–33.0)
MCHC: 35.1 g/dL (ref 31.0–37.0)
MCV: 86.3 fL (ref 77.0–95.0)
PLATELETS: 175 10*3/uL (ref 150–400)
RBC: 4.75 MIL/uL (ref 3.80–5.20)
RDW: 13.4 % (ref 11.3–15.5)
WBC: 11.5 10*3/uL (ref 4.5–13.5)

## 2014-02-07 ENCOUNTER — Encounter (HOSPITAL_COMMUNITY): Payer: Self-pay | Admitting: Family

## 2014-09-18 IMAGING — US US OB COMP +14 WK
1 series · 12 of 28 positions shown · non-contrast
Comparison: none

[Series 1: us ob comp +14 wk · 84 acquisitions, 12 frames shown]
[im 4/84]
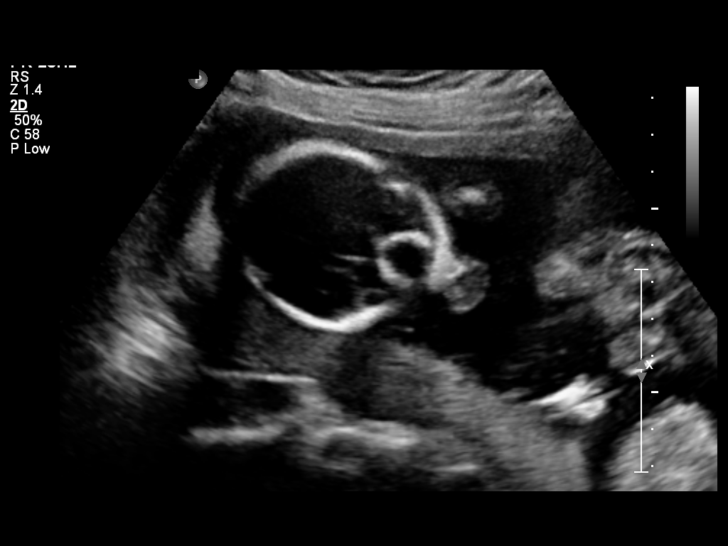
[im 10/84]
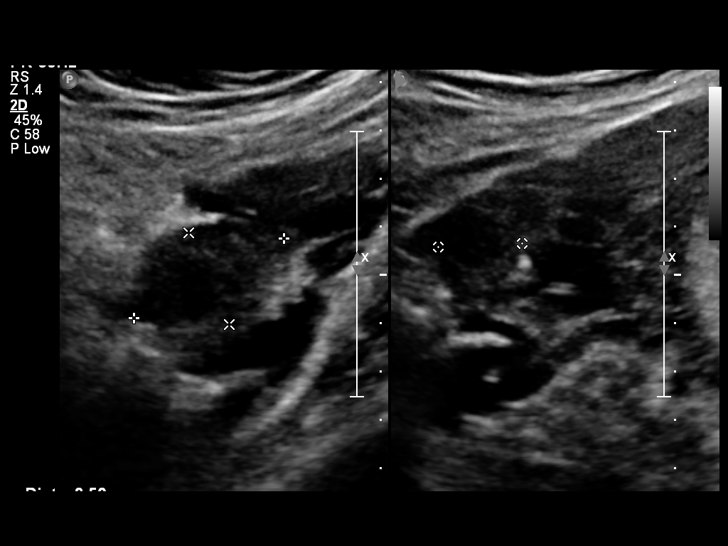
[im 16/84]
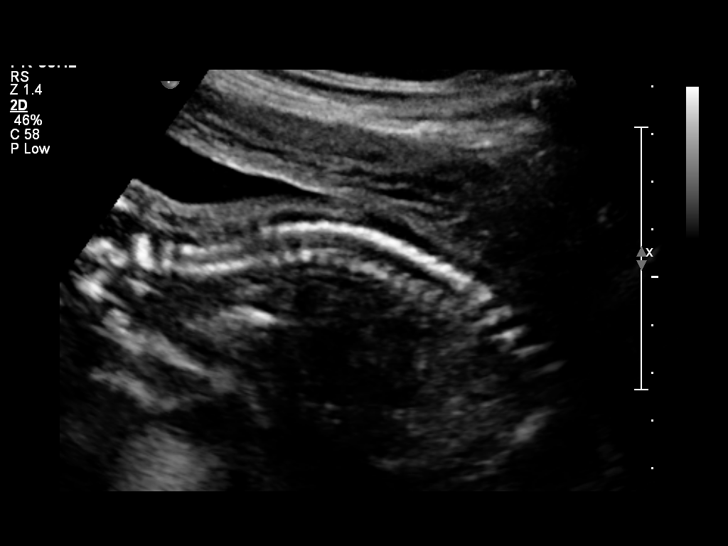
[im 25/84]
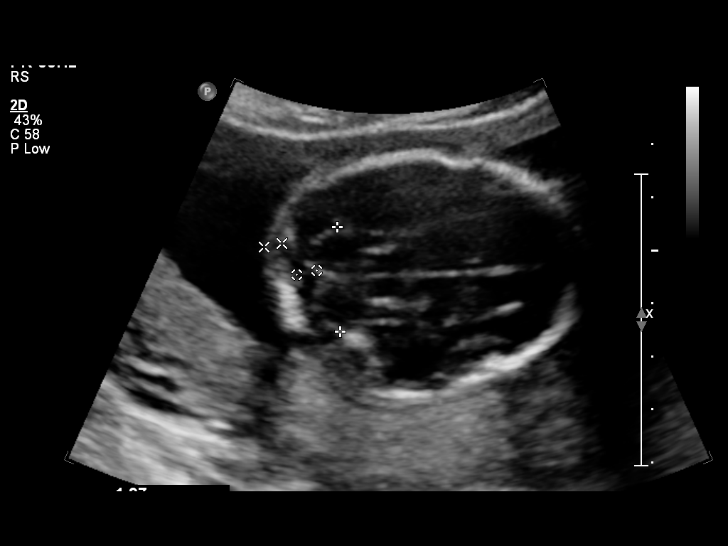
[im 31/84]
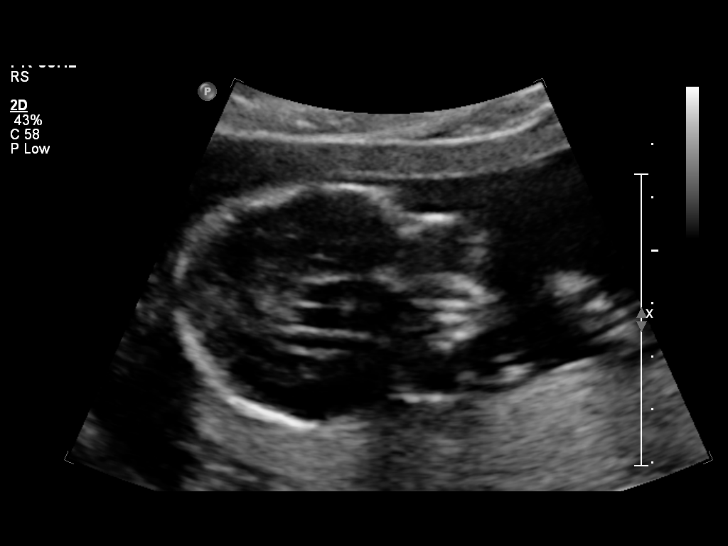
[im 37/84]
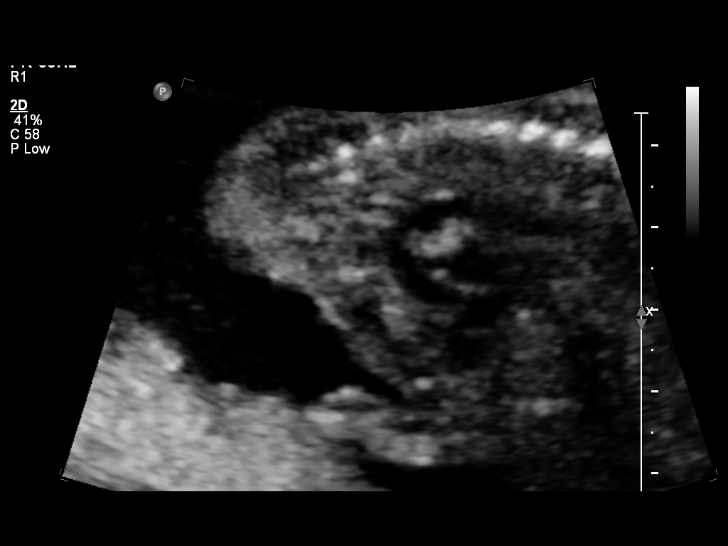
[im 47/84]
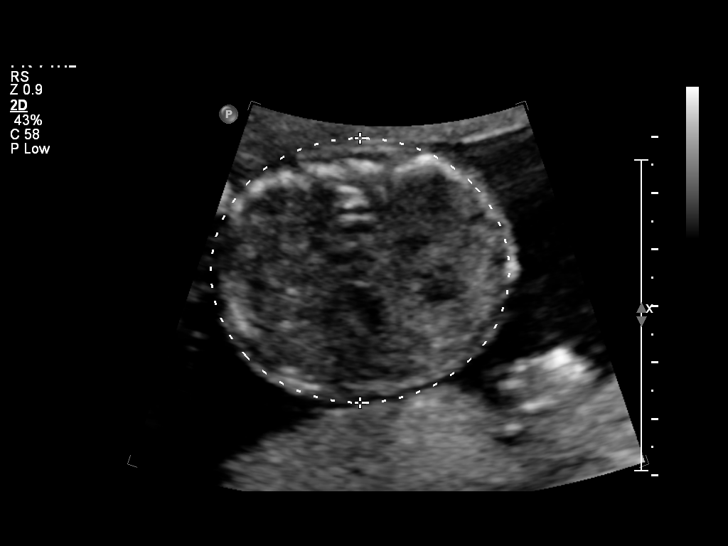
[im 53/84]
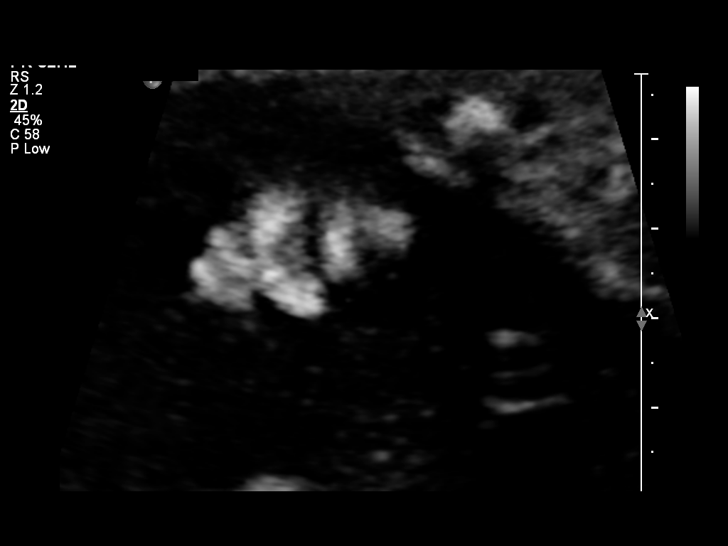
[im 59/84]
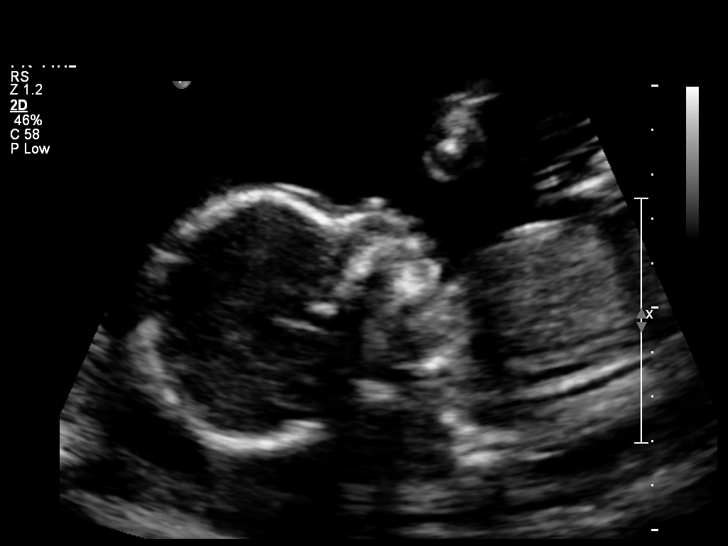
[im 68/84]
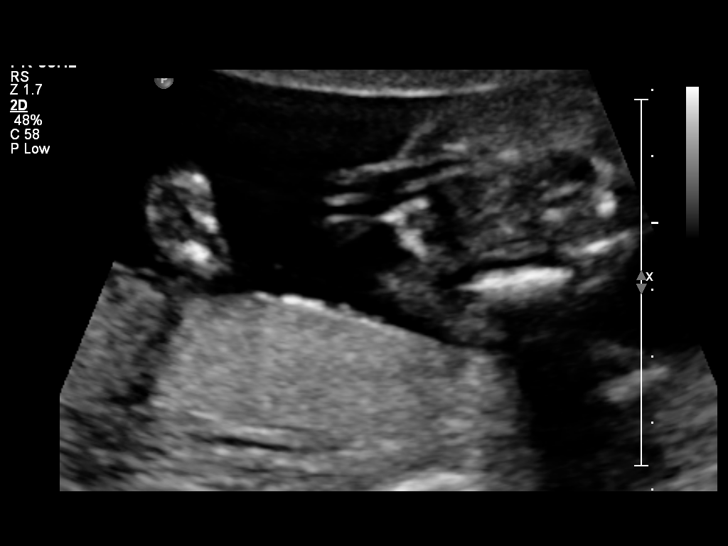
[im 74/84]
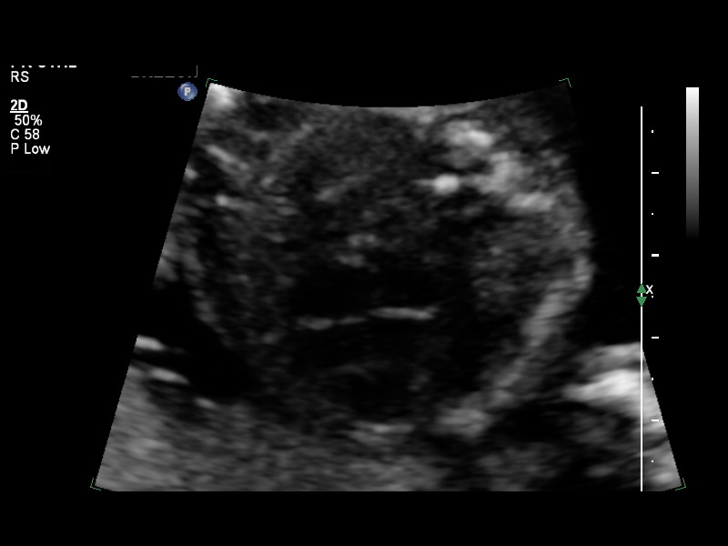
[im 80/84]
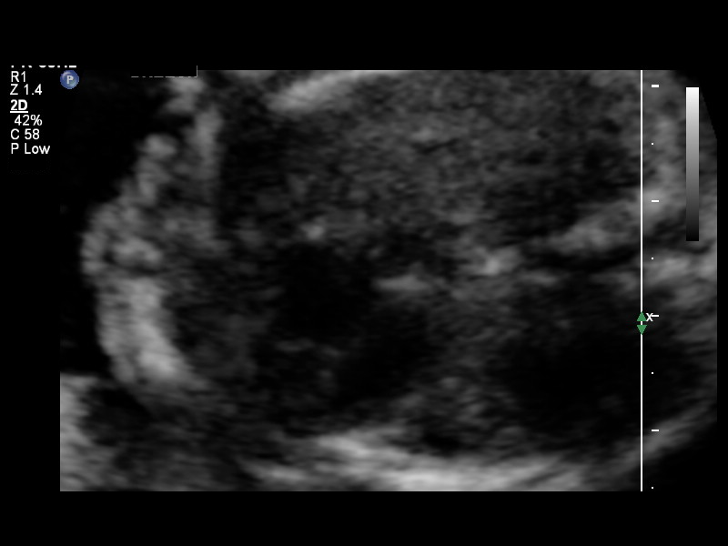

[12 of 28 positions shown; findings below may reference images not displayed]

OBSTETRICS REPORT
                      (Signed Final 05/04/2013 [DATE])

             JARITZA

Service(s) Provided

 US OB COMP + 14 WK                                    76805.1
Indications

 Basic anatomic survey
Fetal Evaluation

 Num Of Fetuses:    1
 Fetal Heart Rate:  156                          bpm
 Cardiac Activity:  Observed
 Presentation:      Breech
 Placenta:          Posterior, above cervical
                    os
 P. Cord            Visualized, central
 Insertion:

 Amniotic Fluid
 AFI FV:      Subjectively within normal limits
                                             Larg Pckt:     4.0  cm
Biometry

 BPD:     45.1  mm     G. Age:  19w 4d                CI:        70.25   70 - 86
                                                      FL/HC:      18.5   16.1 -

 HC:     171.6  mm     G. Age:  19w 5d       64  %    HC/AC:      1.12   1.09 -

 AC:     153.5  mm     G. Age:  20w 4d       82  %    FL/BPD:
 FL:      31.7  mm     G. Age:  19w 6d       63  %    FL/AC:      20.7   20 - 24
 CER:     19.7  mm     G. Age:  18w 6d       40  %
 NFT:     3.42  mm

 Est. FW:     336  gm    0 lb 12 oz      59  %
Gestational Age

 LMP:           19w 2d        Date:  12/20/12                 EDD:   09/26/13
 U/S Today:     19w 6d                                        EDD:   09/22/13
 Best:          19w 2d     Det. By:  LMP  (12/20/12)          EDD:   09/26/13
Anatomy

 Cranium:          Appears normal         Aortic Arch:      Appears normal
 Fetal Cavum:      Appears normal         Ductal Arch:      Not well visualized
 Ventricles:       Appears normal         Diaphragm:        Appears normal
 Choroid Plexus:   Appears normal         Stomach:          Appears normal, left
                                                            sided
 Cerebellum:       Appears normal         Abdomen:          Appears normal
 Posterior Fossa:  Appears normal         Abdominal Wall:   Appears nml (cord
                                                            insert, abd wall)
 Nuchal Fold:      Appears normal         Cord Vessels:     Appears normal (3
                                                            vessel cord)
 Face:             Appears normal         Kidneys:          Appear normal
                   (orbits and profile)
 Lips:             Appears normal         Bladder:          Appears normal
 Heart:            Appears normal         Spine:            Appears normal
                   (4CH, axis, and
                   situs)
 RVOT:             Not well visualized    Lower             Appears normal
                                          Extremities:
 LVOT:             Appears normal         Upper             Appears normal
                                          Extremities:

 Other:  Fetus appears to be a male - ?hypospadia. Heels and 5th digit
         visualized. Nasal bone visualized. Technically difficult due to fetal
         position.
Targeted Anatomy

 Fetal Central Nervous System
 Lat. Ventricles:  5.2                    Cisterna Magna:
Cervix Uterus Adnexa

 Cervical Length:    4.06     cm

 Cervix:       Normal appearance by transabdominal scan.
 Left Ovary:    Within normal limits.
 Right Ovary:   Within normal limits.

 Adnexa:     No abnormality visualized.
Impression

 IUP at 19+2 weeks
 Normal detailed fetal anatomy; limited views of heart but
 overall, grossly normal; some views of genitalia were
 suspicious for hypospadia ??
 Markers of aneuploidy: none
 Normal amniotic fluid volume
 Measurements consistent with LMP dating
Recommendations

 Optional: follow-up ultrasound in 6-8 weeks to reassess heart
 and genitalia
